# Patient Record
Sex: Female | Born: 1960 | Race: White | Hispanic: No | Marital: Married | Smoking: Former smoker
Health system: Southern US, Community
[De-identification: ages and names within clinical notes are randomized; demographics above are authoritative.]

---

## 1998-07-26 ENCOUNTER — Other Ambulatory Visit: Admission: RE | Admit: 1998-07-26 | Discharge: 1998-07-26 | Payer: Self-pay | Admitting: *Deleted

## 2001-04-23 ENCOUNTER — Other Ambulatory Visit: Admission: RE | Admit: 2001-04-23 | Discharge: 2001-04-23 | Payer: Self-pay | Admitting: *Deleted

## 2001-05-19 ENCOUNTER — Other Ambulatory Visit: Admission: RE | Admit: 2001-05-19 | Discharge: 2001-05-19 | Payer: Self-pay | Admitting: *Deleted

## 2001-06-01 ENCOUNTER — Other Ambulatory Visit: Admission: RE | Admit: 2001-06-01 | Discharge: 2001-06-01 | Payer: Self-pay | Admitting: *Deleted

## 2001-06-01 ENCOUNTER — Encounter (INDEPENDENT_AMBULATORY_CARE_PROVIDER_SITE_OTHER): Payer: Self-pay | Admitting: Specialist

## 2001-09-02 ENCOUNTER — Other Ambulatory Visit: Admission: RE | Admit: 2001-09-02 | Discharge: 2001-09-02 | Payer: Self-pay | Admitting: *Deleted

## 2001-09-02 ENCOUNTER — Encounter (INDEPENDENT_AMBULATORY_CARE_PROVIDER_SITE_OTHER): Payer: Self-pay

## 2005-12-31 ENCOUNTER — Other Ambulatory Visit: Admission: RE | Admit: 2005-12-31 | Discharge: 2005-12-31 | Payer: Self-pay | Admitting: Family Medicine

## 2008-02-11 ENCOUNTER — Other Ambulatory Visit: Admission: RE | Admit: 2008-02-11 | Discharge: 2008-02-11 | Payer: Self-pay | Admitting: Family Medicine

## 2009-04-11 ENCOUNTER — Other Ambulatory Visit: Admission: RE | Admit: 2009-04-11 | Discharge: 2009-04-11 | Payer: Self-pay | Admitting: Family Medicine

## 2012-05-05 ENCOUNTER — Other Ambulatory Visit: Payer: Self-pay | Admitting: Radiology

## 2014-08-30 ENCOUNTER — Ambulatory Visit (INDEPENDENT_AMBULATORY_CARE_PROVIDER_SITE_OTHER): Payer: BC Managed Care – PPO | Admitting: Internal Medicine

## 2014-08-30 ENCOUNTER — Encounter: Payer: Self-pay | Admitting: Internal Medicine

## 2014-08-30 VITALS — BP 104/62 | HR 80 | Temp 98.1°F | Resp 12 | Ht 66.0 in | Wt 142.8 lb

## 2014-08-30 DIAGNOSIS — E039 Hypothyroidism, unspecified: Secondary | ICD-10-CM

## 2014-08-30 MED ORDER — SYNTHROID 125 MCG PO TABS: 125.0000 ug | ORAL_TABLET | Freq: Every day | ORAL | Status: AC

## 2014-08-30 NOTE — Progress Notes (Signed)
Patient ID: Emma Little, female   DOB: [birth year], 53 y.o.   MRN: [id number]  HPI  Emma Little is a 53 y.o.-year-old female, referred by her PCP, Dr. Kenton Kingfisher, for management of hypothyroidism.  Pt. has been dx with hypothyroidism in ~2005; is on Synthroid 125 mcg (changed 06/2014), taken: - fasting - with milk; if takes it water >> nausea/vomiting - with Wellbutrin - separated by >30 min from b'fast  - + calcium with b'fast - + multivitamins with b'fast - No iron, PPIs  I reviewed pt's thyroid tests: 07/06/2014: TSH 6.78, fT4 1.59 03/07/2014: TSH 0.39; fT4 2.00   Pt describes: - no weight gain/loss - + fatigue - + cold intolerance - + depression - no constipation - no dry skin - no hair falling  Pt denies feeling nodules in neck, hoarseness, dysphagia/odynophagia, SOB with lying down. She has a dry cough.  She has + FH of thyroid disorders in: goiter in mother; aunt. No FH of thyroid cancer, but does not know about father's side of the family.  No h/o radiation tx to head or neck. No recent use of iodine supplements.  I reviewed her chart and she also has a history of depression.  ROS: Constitutional: see HPI, + poor sleep Eyes: no blurry vision, no xerophthalmia ENT: no sore throat, no nodules palpated in throat, no dysphagia/odynophagia, no hoarseness, + decreased hearing Cardiovascular: no CP/SOB/palpitations/+ leg swelling Respiratory: no cough/SOB Gastrointestinal: no N/V/D/C, + occasional heartburn Musculoskeletal: + muscle aches/no joint aches Skin: no rashes Neurological: no tremors/numbness/tingling/dizziness Psychiatric: + depression/no anxiety  PMH: - depression  PSxH: - TAH - fallopian tube removed after tubal pregnancy 1994 - Lasik eye sx - C section 1992  History   Social History  . Marital Status: Married    Spouse Name: N/A    Number of Children: 65, 9 y/o   Occupational History  . Administrative assistant   Social History Main  Topics  . Smoking status: Former Smoker    Quit date: 11/30/1990  . Smokeless tobacco: Not on file  . Alcohol Use: rarely  . Drug Use: No   Current Outpatient Rx  Name  Route  Sig  Dispense  Refill  . Biotin 5000 MCG CAPS   Oral   Take 1 capsule by mouth daily.         Marland Kitchen buPROPion (WELLBUTRIN XL) 150 MG 24 hr tablet   Oral   Take 150 mg by mouth daily.         . Calcium Carbonate-Vitamin D (CALTRATE 600+D PO)   Oral   Take 1 tablet by mouth daily.         Marland Kitchen levothyroxine (SYNTHROID, LEVOTHROID) 125 MCG tablet   Oral   Take 125 mcg by mouth daily before breakfast.         . LYSINE PO   Oral   Take 1 tablet by mouth daily.         . Multiple Vitamin (MULTIVITAMIN) tablet   Oral   Take 1 tablet by mouth daily.         Marland Kitchen penciclovir (DENAVIR) 1 % cream   Topical   Apply 1 application topically as needed.         . valACYclovir (VALTREX) 500 MG tablet   Oral   Take 500 mg by mouth as needed.          No family history on file.  NKDA  PE: BP 104/62  Pulse 80  Temp(Src) 98.1 F (36.7  C) (Oral)  Resp 12  Ht 5\' 6"  (1.676 m)  Wt 142 lb 12.8 oz (64.774 kg)  BMI 23.06 kg/m2  SpO2 99% Wt Readings from Last 3 Encounters:  08/30/14 142 lb 12.8 oz (64.774 kg)   Constitutional: normal weight, in NAD Eyes: PERRLA, EOMI, no exophthalmos ENT: moist mucous membranes, no thyromegaly, no cervical lymphadenopathy Cardiovascular: RRR, No MRG Respiratory: CTA B Gastrointestinal: abdomen soft, NT, ND, BS+ Musculoskeletal: no deformities, strength intact in all 4 Skin: moist, warm, no rashes Neurological: no tremor with outstretched hands, DTR normal in all 4  ASSESSMENT: 1. Hypothyroidism - on LT4 DAW  PLAN:  1. Patient with long-standing hypothyroidism, on levothyroxine therapy. She appears euthyroid. She does not appear to have a goiter, thyroid nodules, or neck compression symptoms - We discussed about correct intake of levothyroxine, fasting, with  water, separated by at least 30 minutes from breakfast, and separated by more than 4 hours from calcium, iron, multivitamins, acid reflux medications (PPIs). Advised her to move the calcium and MVI later in the day and not to take the LT4 with milk.  - will check thyroid tests in 1.5 mo after the above change: TSH, free T4: Orders Placed This Encounter  Procedures  . TSH  . T4, free  - If these are abnormal, she will need to return in 6-8 weeks for repeat labs - I will see her back in 6 months - advised her to join MyChart - for now, continue Synthroid 125 mcg daily

## 2014-08-30 NOTE — Patient Instructions (Signed)
Please come back for labs in 5-6 weeks. Please come back for a follow-up appointment in 6 months.  Take the thyroid hormone every day, with water, >30 minutes before breakfast, separated by >4 hours from acid reflux medications, calcium, iron, multivitamins.  Please try to join MyChart for easier communication.

## 2014-10-11 ENCOUNTER — Other Ambulatory Visit: Payer: BC Managed Care – PPO

## 2014-10-12 ENCOUNTER — Other Ambulatory Visit (INDEPENDENT_AMBULATORY_CARE_PROVIDER_SITE_OTHER): Payer: BC Managed Care – PPO

## 2014-10-12 DIAGNOSIS — E039 Hypothyroidism, unspecified: Secondary | ICD-10-CM

## 2014-10-12 LAB — T4, FREE: Free T4: 1.5 ng/dL (ref 0.60–1.60)

## 2014-10-12 LAB — TSH: TSH: 0.6 u[IU]/mL (ref 0.35–4.50)

## 2014-10-13 ENCOUNTER — Encounter: Payer: Self-pay | Admitting: *Deleted

## 2015-03-01 ENCOUNTER — Ambulatory Visit: Payer: BC Managed Care – PPO | Admitting: Internal Medicine

## 2015-04-20 ENCOUNTER — Ambulatory Visit: Payer: Self-pay | Admitting: Internal Medicine

## 2017-01-21 DIAGNOSIS — H52223 Regular astigmatism, bilateral: Secondary | ICD-10-CM | POA: Diagnosis not present

## 2017-01-21 DIAGNOSIS — H524 Presbyopia: Secondary | ICD-10-CM | POA: Diagnosis not present

## 2017-01-21 DIAGNOSIS — H5203 Hypermetropia, bilateral: Secondary | ICD-10-CM | POA: Diagnosis not present

## 2017-03-03 DIAGNOSIS — E7800 Pure hypercholesterolemia, unspecified: Secondary | ICD-10-CM | POA: Diagnosis not present

## 2017-03-03 DIAGNOSIS — N951 Menopausal and female climacteric states: Secondary | ICD-10-CM | POA: Diagnosis not present

## 2017-03-03 DIAGNOSIS — Z0000 Encounter for general adult medical examination without abnormal findings: Secondary | ICD-10-CM | POA: Diagnosis not present

## 2017-03-03 DIAGNOSIS — E039 Hypothyroidism, unspecified: Secondary | ICD-10-CM | POA: Diagnosis not present

## 2017-06-04 DIAGNOSIS — D220 Melanocytic nevi of lip: Secondary | ICD-10-CM | POA: Diagnosis not present

## 2017-06-04 DIAGNOSIS — D2261 Melanocytic nevi of right upper limb, including shoulder: Secondary | ICD-10-CM | POA: Diagnosis not present

## 2017-06-04 DIAGNOSIS — L821 Other seborrheic keratosis: Secondary | ICD-10-CM | POA: Diagnosis not present

## 2017-10-24 DIAGNOSIS — Z1231 Encounter for screening mammogram for malignant neoplasm of breast: Secondary | ICD-10-CM | POA: Diagnosis not present

## 2017-10-29 ENCOUNTER — Other Ambulatory Visit: Payer: Self-pay | Admitting: Radiology

## 2017-10-29 DIAGNOSIS — R921 Mammographic calcification found on diagnostic imaging of breast: Secondary | ICD-10-CM | POA: Diagnosis not present

## 2017-10-29 DIAGNOSIS — N6011 Diffuse cystic mastopathy of right breast: Secondary | ICD-10-CM | POA: Diagnosis not present

## 2017-10-29 DIAGNOSIS — R922 Inconclusive mammogram: Secondary | ICD-10-CM | POA: Diagnosis not present

## 2018-01-30 DIAGNOSIS — H52223 Regular astigmatism, bilateral: Secondary | ICD-10-CM | POA: Diagnosis not present

## 2018-01-30 DIAGNOSIS — H5203 Hypermetropia, bilateral: Secondary | ICD-10-CM | POA: Diagnosis not present

## 2018-01-30 DIAGNOSIS — H524 Presbyopia: Secondary | ICD-10-CM | POA: Diagnosis not present

## 2018-03-04 DIAGNOSIS — Z0000 Encounter for general adult medical examination without abnormal findings: Secondary | ICD-10-CM | POA: Diagnosis not present

## 2018-03-04 DIAGNOSIS — E039 Hypothyroidism, unspecified: Secondary | ICD-10-CM | POA: Diagnosis not present

## 2018-03-04 DIAGNOSIS — Z23 Encounter for immunization: Secondary | ICD-10-CM | POA: Diagnosis not present

## 2018-03-04 DIAGNOSIS — E7800 Pure hypercholesterolemia, unspecified: Secondary | ICD-10-CM | POA: Diagnosis not present

## 2018-06-23 DIAGNOSIS — E039 Hypothyroidism, unspecified: Secondary | ICD-10-CM | POA: Diagnosis not present

## 2018-10-26 DIAGNOSIS — Z1231 Encounter for screening mammogram for malignant neoplasm of breast: Secondary | ICD-10-CM | POA: Diagnosis not present

## 2019-03-09 DIAGNOSIS — E7800 Pure hypercholesterolemia, unspecified: Secondary | ICD-10-CM | POA: Diagnosis not present

## 2019-03-09 DIAGNOSIS — E039 Hypothyroidism, unspecified: Secondary | ICD-10-CM | POA: Diagnosis not present

## 2019-07-30 ENCOUNTER — Other Ambulatory Visit: Payer: Self-pay | Admitting: Family Medicine

## 2019-07-30 ENCOUNTER — Ambulatory Visit
Admission: RE | Admit: 2019-07-30 | Discharge: 2019-07-30 | Disposition: A | Discharge status: Home / Self Care | Payer: 59 | Source: Ambulatory Visit | Attending: Family Medicine | Admitting: Family Medicine

## 2019-07-30 DIAGNOSIS — M779 Enthesopathy, unspecified: Secondary | ICD-10-CM

## 2021-01-21 IMAGING — CR DG SKULL COMPLETE 4+V
4 series · 4 of 4 positions shown · non-contrast
Comparison: None.

CLINICAL DATA: Possible palpable bone spur.

EXAM:
SKULL - COMPLETE 4 + VIEW

[[person_name] pa]
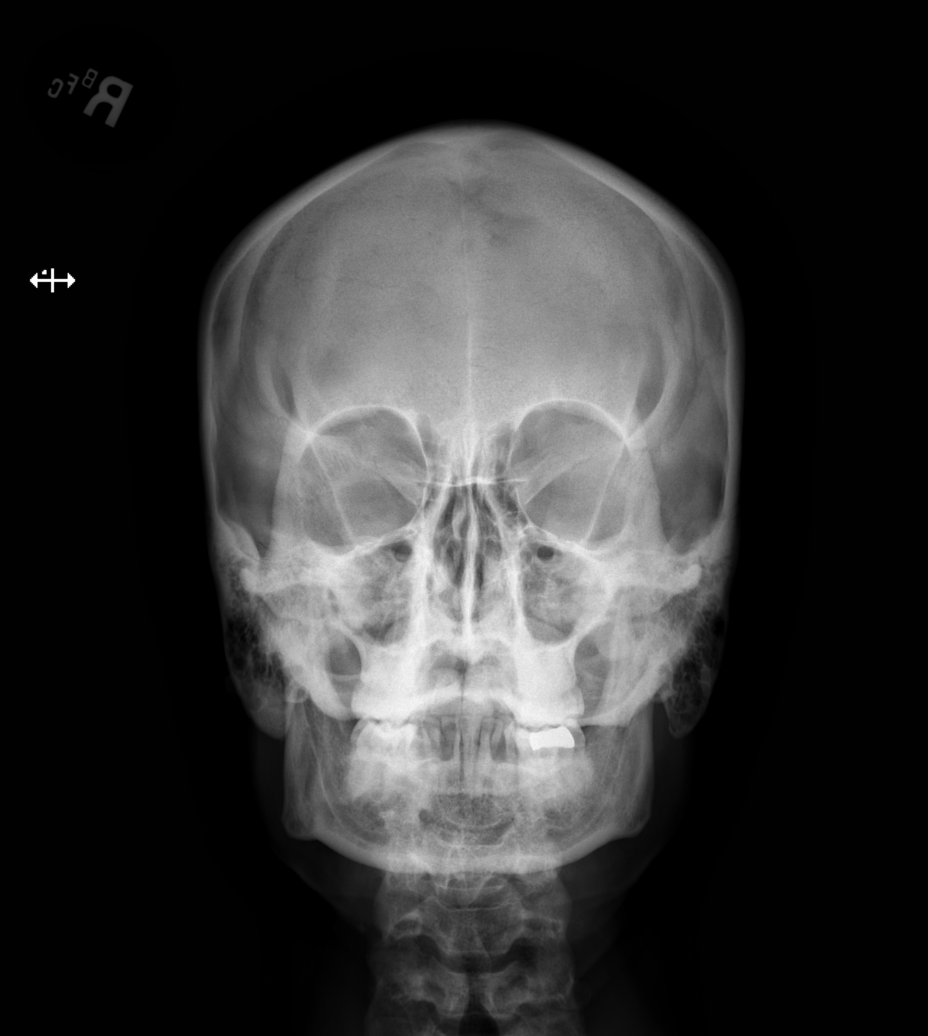

[[person_name]]
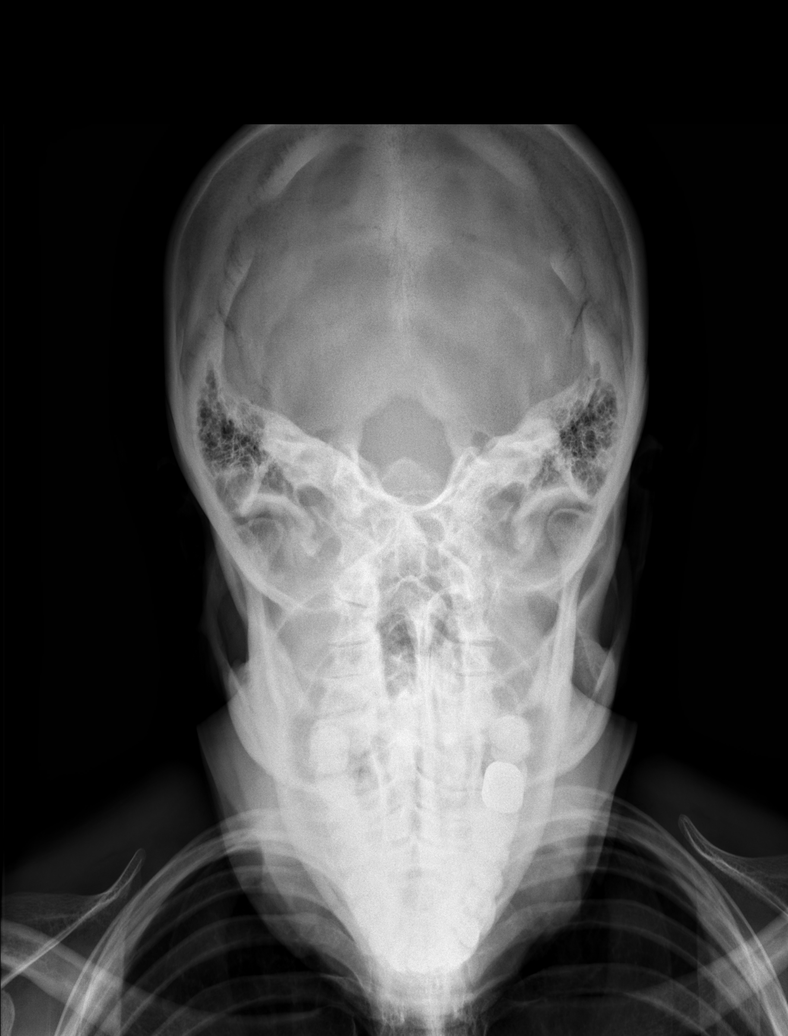

[w skull lat (1 of 2)]
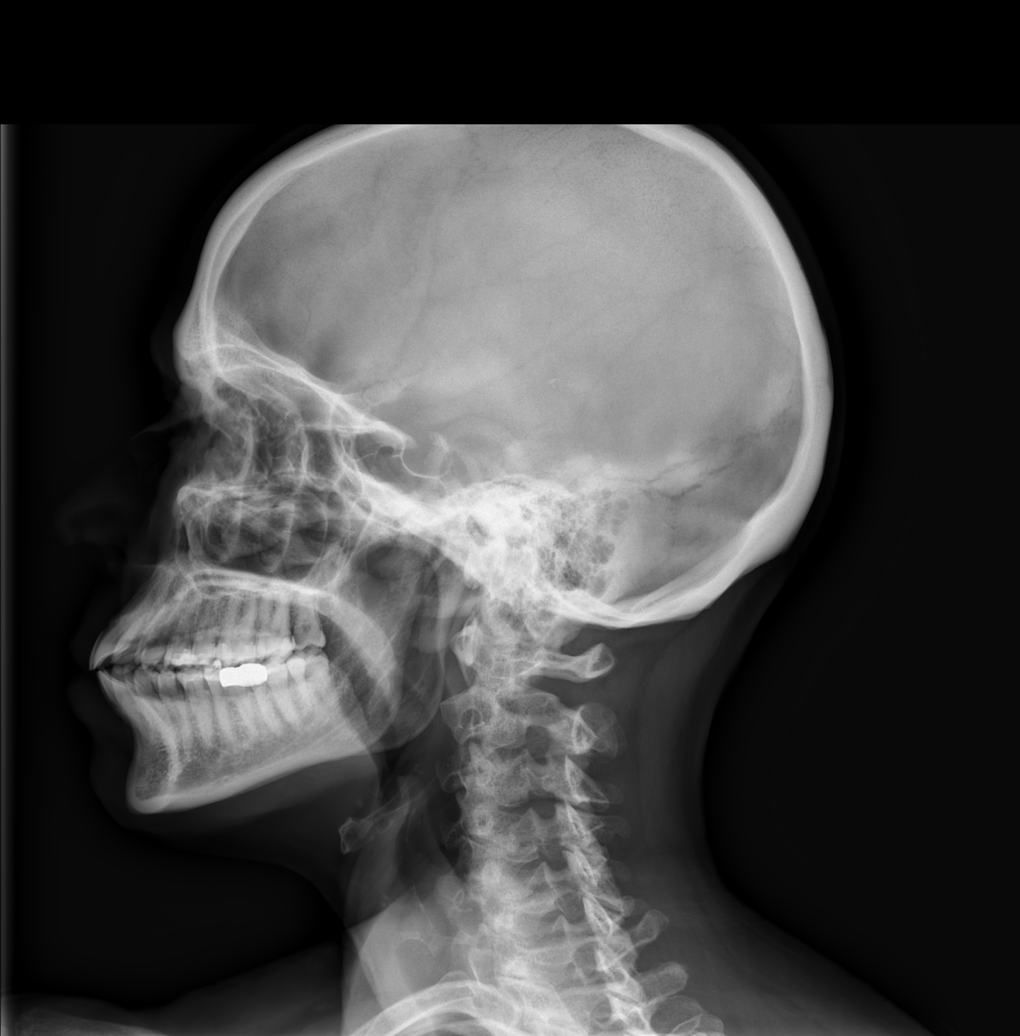

[w skull lat (2 of 2)]
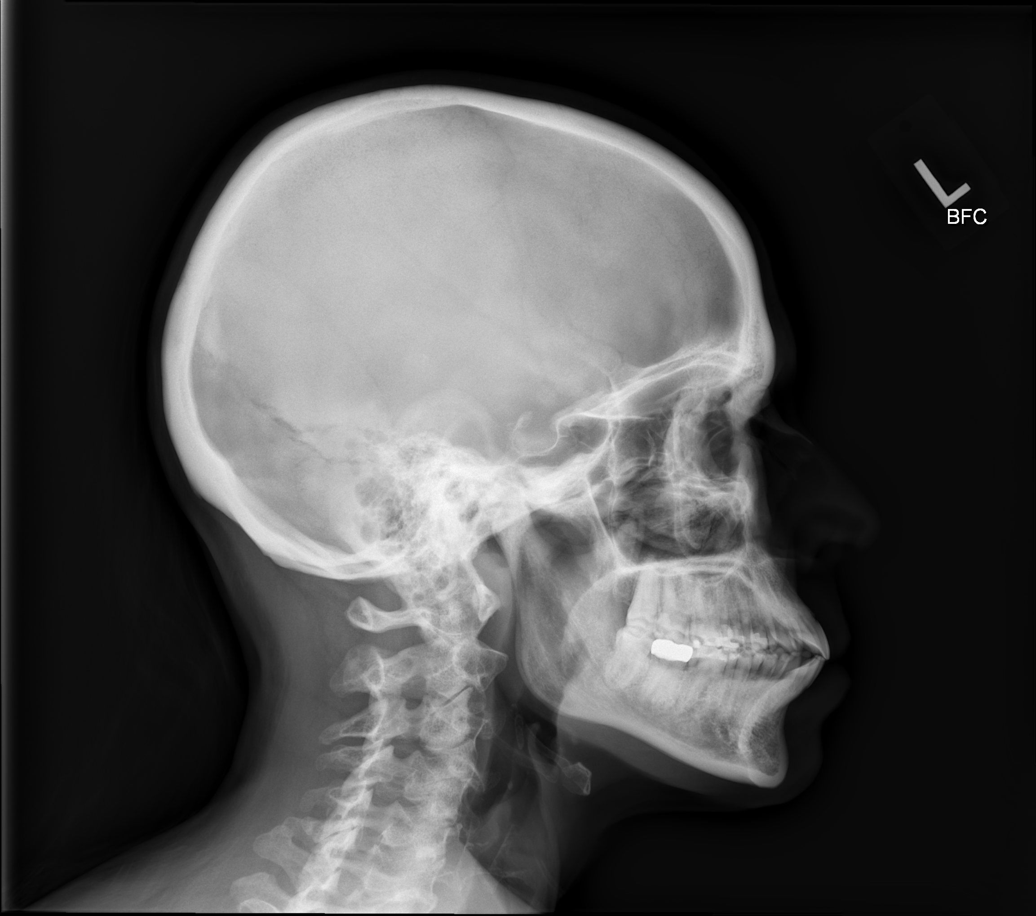

[4 of 4 positions shown; findings below may reference images not displayed]

FINDINGS: There is no evidence of skull fracture. Focal density is seen
involving the inferior portion of the right mastoid bone which most
likely represents fluid or inflammation within the mastoid air
cells. No definite bony protuberance is noted.
IMPRESSION: Focal density is seen involving inferior portion of right mastoid
bone which may simply represent fluid or inflammation within the
mastoid air cells. No definite bony protuberance is noted.
